# Patient Record
Sex: Male | Born: 1990 | Race: Black or African American | Hispanic: No | Marital: Single | Smoking: Current every day smoker
Health system: Southern US, Community
[De-identification: ages and names within clinical notes are randomized; demographics above are authoritative.]

## PROBLEM LIST (undated history)

## (undated) DIAGNOSIS — I10 Essential (primary) hypertension: Secondary | ICD-10-CM

## (undated) HISTORY — PX: WISDOM TOOTH EXTRACTION: SHX21

## (undated) HISTORY — DX: Essential (primary) hypertension: I10

---

## 2011-08-19 ENCOUNTER — Ambulatory Visit (INDEPENDENT_AMBULATORY_CARE_PROVIDER_SITE_OTHER): Payer: Federal, State, Local not specified - PPO | Admitting: Family Medicine

## 2011-08-19 VITALS — BP 161/74 | HR 73 | Temp 98.0°F | Resp 16 | Ht 67.38 in | Wt 187.0 lb

## 2011-08-19 DIAGNOSIS — R030 Elevated blood-pressure reading, without diagnosis of hypertension: Secondary | ICD-10-CM

## 2011-08-19 NOTE — Progress Notes (Signed)
  Patient Name: Kenroy Timberman Date of Birth: 1990-08-16 Medical Record Number: [id number] Gender: male Date of Encounter: 08/19/2011  History of Present Illness:  Deshane Cotroneo is a 21 y.o. 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 However, when he had his physical earlier this month he was told his BP was too high.  He is not sure of the exact number but he thinks the systolic was in the 140s.  He has been seen at [facility] once about a year ago at which time his BP was ok However, his mother was diagnosed with HTN at 31 years old. Otherwise no family history of HTN that he knows of. He is physically active and does not use excessive caffeine/ energy pills/ or any drugs  There is no problem list on file for this patient.  No past medical history on file. No past surgical history on file. History  Substance Use Topics  . Smoking status: Former Smoker    Quit date: 07/20/2011  . Smokeless tobacco: Not on file  . Alcohol Use: Not on file   No family history on file. No Known Allergies  Medication list has been reviewed and updated.  Review of Systems: As per HPI- otherwise negative.   Physical Examination: Filed Vitals:   08/19/11 1637  BP: 161/74  Pulse: 73  Temp: 98 F (36.7 C)  TempSrc: Oral  Resp: 16  Height: 5' 7.38" (1.711 m)  Weight: 187 lb (84.823 kg)    Body mass index is 28.96 kg/(m^2).  GEN: WDWN, NAD, Non-toxic, A & O x 3 HEENT: Atraumatic, Normocephalic. Neck supple. No masses, No LAD. Ears and Nose: No external deformity. CV: RRR, No M/G/R. No JVD. No thrill. No extra heart sounds. PULM: CTA B, no wheezes, crackles, rhonchi. No retractions. No resp. distress. No accessory muscle use. ABD: S, NT, ND, +BS. No rebound. No HSM. EXTR: No c/c/e NEURO Normal gait.  PSYCH: Normally interactive. Conversant. Not depressed or anxious appearing.  Calm demeanor.  Bp recheck abut 136/ 92.     Assessment and Plan: 1. Elevated BP    BP readings reasonable here today.  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

## 2011-08-20 ENCOUNTER — Ambulatory Visit (INDEPENDENT_AMBULATORY_CARE_PROVIDER_SITE_OTHER): Payer: Federal, State, Local not specified - PPO | Admitting: Family Medicine

## 2011-08-20 VITALS — BP 125/75 | HR 49 | Temp 97.9°F | Resp 16 | Ht 67.5 in | Wt 187.8 lb

## 2011-08-20 DIAGNOSIS — Z0130 Encounter for examination of blood pressure without abnormal findings: Secondary | ICD-10-CM

## 2011-08-20 DIAGNOSIS — Z136 Encounter for screening for cardiovascular disorders: Secondary | ICD-10-CM

## 2011-08-20 NOTE — Progress Notes (Signed)
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  See note from yesterday.  BP today 125/75 and pulse 49.  Feeling well.  Will RTC tomorrow for a last BP check.

## 2011-08-21 ENCOUNTER — Ambulatory Visit (INDEPENDENT_AMBULATORY_CARE_PROVIDER_SITE_OTHER): Payer: Federal, State, Local not specified - PPO | Admitting: Family Medicine

## 2011-08-21 VITALS — BP 123/73 | HR 61 | Temp 98.0°F | Resp 16 | Ht 67.5 in | Wt 187.0 lb

## 2011-08-21 DIAGNOSIS — Z0130 Encounter for examination of blood pressure without abnormal findings: Secondary | ICD-10-CM

## 2011-08-21 DIAGNOSIS — Z136 Encounter for screening for cardiovascular disorders: Secondary | ICD-10-CM

## 2011-08-21 NOTE — Progress Notes (Signed)
Here today for a BP recheck.    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   BP today 123/73, pulse 61.  Competed form, he had 3 good BP readings so we hope he will be able to pass his physical exam.  He will let us know if we needs anything further.

## 2011-08-26 ENCOUNTER — Ambulatory Visit (INDEPENDENT_AMBULATORY_CARE_PROVIDER_SITE_OTHER): Payer: Federal, State, Local not specified - PPO | Admitting: Family Medicine

## 2011-08-26 VITALS — BP 124/72 | HR 51 | Resp 18

## 2011-08-26 DIAGNOSIS — Z0130 Encounter for examination of blood pressure without abnormal findings: Secondary | ICD-10-CM

## 2011-08-26 DIAGNOSIS — I10 Essential (primary) hypertension: Secondary | ICD-10-CM

## 2011-08-26 NOTE — Progress Notes (Signed)
  Patient Name: Kyle Strong Date of Birth: 06-11-1990 Medical Record Number: [id number] Gender: male Date of Encounter: 08/26/2011  History of Present Illness:  Kyle Strong is a 21 y.o. 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  No past medical history on file. No past surgical history on file. History  Substance Use Topics  . Smoking status: Former Smoker    Quit date: 07/20/2011  . Smokeless tobacco: Not on file  . Alcohol Use: Not on file   No family history on file. No Known Allergies  Medication list has been reviewed and updated.  Review of Systems: As per HPI- otherwise negative. Feels fine  Physical Examination: Filed Vitals:   08/26/11 1740  BP: 124/72  Pulse: 51  Resp: 18    There is no height or weight on file to calculate BMI.   GEN: WDWN, NAD, Non-toxic, Alert & Oriented x 3 HEENT: Atraumatic, Normocephalic.  Ears and Nose: No external deformity. EXTR: No clubbing/cyanosis/edema NEURO: Normal gait.  PSYCH: Normally interactive. Conversant. Not depressed or anxious appearing.  Calm demeanor.  BP ok today  Assessment and Plan: 1. BP check    He will return twice more for BP check

## 2011-08-27 ENCOUNTER — Ambulatory Visit (INDEPENDENT_AMBULATORY_CARE_PROVIDER_SITE_OTHER): Payer: Federal, State, Local not specified - PPO | Admitting: Physician Assistant

## 2011-08-27 VITALS — BP 115/75 | HR 50 | Temp 98.2°F | Resp 16 | Ht 67.0 in | Wt 184.0 lb

## 2011-08-27 DIAGNOSIS — R892 Abnormal level of other drugs, medicaments and biological substances in specimens from other organs, systems and tissues: Secondary | ICD-10-CM

## 2011-08-27 DIAGNOSIS — R030 Elevated blood-pressure reading, without diagnosis of hypertension: Secondary | ICD-10-CM

## 2011-08-27 NOTE — Progress Notes (Signed)
Presents for BP check.  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  One reading of systolic pressure >130, so is having to repeat the measurements.  O: VS noted.  BP is normal. A&Ox3.  A: Elevated BP, normal reading today without treatment.  P: Return tomorrow for 3rd reading, as planned.

## 2011-08-28 ENCOUNTER — Ambulatory Visit (INDEPENDENT_AMBULATORY_CARE_PROVIDER_SITE_OTHER): Payer: Federal, State, Local not specified - PPO | Admitting: Family Medicine

## 2011-08-28 VITALS — BP 124/74 | HR 52 | Resp 16

## 2011-08-28 DIAGNOSIS — I10 Essential (primary) hypertension: Secondary | ICD-10-CM

## 2011-08-28 DIAGNOSIS — Z0130 Encounter for examination of blood pressure without abnormal findings: Secondary | ICD-10-CM

## 2012-01-01 NOTE — Progress Notes (Signed)
BP check  

## 2012-01-02 ENCOUNTER — Ambulatory Visit (INDEPENDENT_AMBULATORY_CARE_PROVIDER_SITE_OTHER): Payer: Federal, State, Local not specified - PPO | Admitting: Emergency Medicine

## 2012-01-02 ENCOUNTER — Ambulatory Visit: Payer: Federal, State, Local not specified - PPO

## 2012-01-02 VITALS — BP 122/80 | HR 67 | Temp 98.0°F | Resp 16 | Ht 66.5 in | Wt 174.8 lb

## 2012-01-02 DIAGNOSIS — M2390 Unspecified internal derangement of unspecified knee: Secondary | ICD-10-CM

## 2012-01-02 MED ORDER — HYDROCODONE-ACETAMINOPHEN 5-325 MG PO TABS: 1.0000 | ORAL_TABLET | ORAL | Status: AC | PRN

## 2012-01-02 MED ORDER — NAPROXEN SODIUM 550 MG PO TABS: 550.0000 mg | ORAL_TABLET | Freq: Two times a day (BID) | ORAL | Status: AC

## 2012-01-02 NOTE — Progress Notes (Signed)
[facility] [address], [place] [address] [phone]  Date:  01/02/2012   Name:  Kyle Strong   DOB:  09-05-1990   MRN:  [id number]  PCP:  No primary provider on file.    Chief Complaint: Knee Pain   History of Present Illness:  Kyle Strong is a 21 y.o. very pleasant male patient who presents with the following:  In [employer] and has been running a great deal.  Says the past few days he has run over 20 miles.  Developed pain in left knee that is severe to the point that he did not participate in PT or the daily run today.  Denies a definite injury  But has difficulty with full extension of the knee. No locking or clicking per patient.  Denies history of prior knee injury.  There is no problem list on file for this patient.   No past medical history on file.  No past surgical history on file.  History  Substance Use Topics  . Smoking status: Former Smoker    Quit date: 07/20/2011  . Smokeless tobacco: Not on file  . Alcohol Use: Not on file    No family history on file.  No Known Allergies  Medication list has been reviewed and updated.  No current outpatient prescriptions on file prior to visit.    Review of Systems:  As per HPI, otherwise negative.    Physical Examination: Filed Vitals:   01/02/12 1207  BP: 122/80  Pulse: 67  Temp: 98 F (36.7 C)  Resp: 16   Filed Vitals:   01/02/12 1207  Height: 5' 6.5" (1.689 m)  Weight: 174 lb 12.8 oz (79.289 kg)   Body mass index is 27.79 kg/(m^2). Ideal Body Weight: Weight in (lb) to have BMI = 25: 156.9    GEN: WDWN, NAD, Non-toxic, Alert & Oriented x 3 HEENT: Atraumatic, Normocephalic.  Ears and Nose: No external deformity. EXTR: No clubbing/cyanosis/edema NEURO: Normal gait.  PSYCH: Normally interactive. Conversant. Not depressed or anxious appearing.  Calm demeanor.  KNEE:  Left knee moderate effusion.  Joint stable with palpable click on full extension.  Full active  ROM.    Assessment and Plan: Internal derangement knee Xray NSAID Follow up in one week NO PE for one week.  Carmelina Dane, MD   [facility] reading (PRIMARY) by  Dr. Dareen Piano. negative. I have reviewed and agree with documentation. Robert P. Merla Riches, M.D.

## 2012-07-28 ENCOUNTER — Emergency Department (HOSPITAL_COMMUNITY)
Admission: EM | Admit: 2012-07-28 | Discharge: 2012-07-28 | Disposition: A | Discharge status: Home / Self Care | Payer: BC Managed Care – PPO | Source: Home / Self Care

## 2012-07-28 ENCOUNTER — Ambulatory Visit: Payer: Federal, State, Local not specified - PPO

## 2012-07-28 ENCOUNTER — Encounter (HOSPITAL_COMMUNITY): Payer: Self-pay | Admitting: *Deleted

## 2012-07-28 DIAGNOSIS — Z711 Person with feared health complaint in whom no diagnosis is made: Secondary | ICD-10-CM

## 2012-07-28 DIAGNOSIS — B354 Tinea corporis: Secondary | ICD-10-CM

## 2012-07-28 DIAGNOSIS — R030 Elevated blood-pressure reading, without diagnosis of hypertension: Secondary | ICD-10-CM

## 2012-07-28 NOTE — ED Notes (Signed)
States he has to come 2 consecutive days to get his BP checked and documented by a Dr., PA or NP. 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  It was 162/88.  He was anxious and nervous.

## 2012-07-28 NOTE — ED Provider Notes (Signed)
History     CSN: [id number]  Arrival date & time 07/28/12  6440   First MD Initiated Contact with Patient 07/28/12 1929      Chief Complaint  Patient presents with  . Blood Pressure Check    (Consider location/radiation/quality/duration/timing/severity/associated sxs/prior treatment) HPI Comments: 22 year old male is attempting to apply for [employer] service. He was found initially to have an elevated blood pressure. He brings in a paper stating that he requires 3 separate blood pressures 15 minutes apart to be recorded on his paper. He is asymptomatic. He denies any medical problems or exertional symptoms. His review of systems are negative.   History reviewed. No pertinent past medical history.  Past Surgical History  Procedure Laterality Date  . Wisdom tooth extraction      History reviewed. No pertinent family history.  History  Substance Use Topics  . Smoking status: Former Smoker    Types: Cigarettes    Quit date: 07/20/2011  . Smokeless tobacco: Not on file  . Alcohol Use: No      Review of Systems  Constitutional: Negative.   All other systems reviewed and are negative.    Allergies  Review of patient's allergies indicates no known allergies.  Home Medications   Current Outpatient Rx  Name  Route  Sig  Dispense  Refill  . naproxen sodium (ANAPROX DS) 550 MG tablet   Oral   Take 1 tablet (550 mg total) by mouth 2 (two) times daily with a meal.   40 tablet   0     BP 141/74  Pulse 70  Resp 16  SpO2 100%  Physical Exam  Nursing note and vitals reviewed. Constitutional: He is oriented to person, place, and time. He appears well-developed and well-nourished. No distress.  HENT:  Head: Normocephalic and atraumatic.  Eyes: EOM are normal.  Neck: Normal range of motion. Neck supple.  Cardiovascular: Normal rate, regular rhythm and normal heart sounds.   No murmur heard. Pulmonary/Chest: Effort normal and breath sounds normal. No respiratory  distress. He has no wheezes.  Musculoskeletal: Normal range of motion. He exhibits no edema.  Neurological: He is alert and oriented to person, place, and time. No cranial nerve deficit. He exhibits normal muscle tone.  Skin: Skin is warm and dry. No rash noted.  Psychiatric: He has a normal mood and affect.    ED Course  Procedures (including critical care time)  Labs Reviewed - No data to display No results found.   1. Physically well but worried   2. Prehypertension       MDM  Followup primary care doctor as needed. No further treatment. 3 blood pressures were recorded while in the emergency department at least 15 minutes apart.         Hayden Rasmussen, NP 07/28/12 2119

## 2012-07-29 ENCOUNTER — Encounter (HOSPITAL_COMMUNITY): Payer: Self-pay | Admitting: *Deleted

## 2012-07-29 ENCOUNTER — Emergency Department (INDEPENDENT_AMBULATORY_CARE_PROVIDER_SITE_OTHER)
Admission: EM | Admit: 2012-07-29 | Discharge: 2012-07-29 | Disposition: A | Discharge status: Home / Self Care | Payer: BC Managed Care – PPO | Source: Home / Self Care | Attending: Family Medicine | Admitting: Family Medicine

## 2012-07-29 ENCOUNTER — Other Ambulatory Visit (HOSPITAL_COMMUNITY)
Admission: RE | Admit: 2012-07-29 | Discharge: 2012-07-29 | Disposition: A | Discharge status: Home / Self Care | Payer: BC Managed Care – PPO | Source: Ambulatory Visit | Attending: Family Medicine | Admitting: Family Medicine

## 2012-07-29 DIAGNOSIS — B354 Tinea corporis: Secondary | ICD-10-CM

## 2012-07-29 DIAGNOSIS — Z113 Encounter for screening for infections with a predominantly sexual mode of transmission: Secondary | ICD-10-CM | POA: Insufficient documentation

## 2012-07-29 MED ORDER — FLUCONAZOLE 200 MG PO TABS: 200.0000 mg | ORAL_TABLET | ORAL | Status: AC

## 2012-07-29 NOTE — ED Notes (Signed)
Pt.'s BP paper copied and put in scan box.

## 2012-07-29 NOTE — ED Notes (Signed)
Pt. trying to get into the [employer] and has to get BP checks on 2 consecutive days.

## 2012-07-30 NOTE — ED Provider Notes (Signed)
Medical screening examination/treatment/procedure(s) were performed by non-physician practitioner and as supervising physician I was immediately available for consultation/collaboration.   [facility] [place]; MD  Sharin Grave, MD 07/30/12 1101

## 2012-07-30 NOTE — ED Provider Notes (Signed)
History     CSN: [id number]  Arrival date & time 07/29/12  1756   First MD Initiated Contact with Patient 07/29/12 2015      Chief Complaint  Patient presents with  . Blood Pressure Check    (Consider location/radiation/quality/duration/timing/severity/associated sxs/prior treatment) HPI Comments: Pt has rash on chest and back for last 3 months, spreading. 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 Here for rash and to have bp rechecked for 2nd day in a row.   Patient is a 22 y.o. male presenting with rash. The history is provided by the patient.  Rash Location:  Torso Torso rash location:  Upper back, L chest, R chest, abd LUQ, abd RUQ, abd LLQ and abd RLQ Quality: not itchy   Severity:  Mild Onset quality:  Gradual Duration:  3 months Timing:  Constant Progression:  Spreading Chronicity:  New Relieved by:  None tried Worsened by:  Nothing tried Ineffective treatments:  None tried Associated symptoms: no fever     History reviewed. No pertinent past medical history.  Past Surgical History  Procedure Laterality Date  . Wisdom tooth extraction      History reviewed. No pertinent family history.  History  Substance Use Topics  . Smoking status: Former Smoker    Types: Cigarettes    Quit date: 07/20/2011  . Smokeless tobacco: Not on file  . Alcohol Use: No      Review of Systems  Constitutional: Negative for fever and chills.  Skin: Positive for rash.    Allergies  Review of patient's allergies indicates no known allergies.  Home Medications   Current Outpatient Rx  Name  Route  Sig  Dispense  Refill  . fluconazole (DIFLUCAN) 200 MG tablet   Oral   Take 1 tablet (200 mg total) by mouth once a week.   4 tablet   0   . naproxen sodium (ANAPROX DS) 550 MG tablet   Oral   Take 1 tablet (550 mg total) by mouth 2 (two) times daily with a meal.   40 tablet   0     BP 131/67  Pulse 70  Temp(Src) 98 F (36.7 C) (Oral)  Resp 16  SpO2  100%  Physical Exam  Constitutional: He appears well-developed and well-nourished. No distress.  Skin: Skin is warm and dry. Rash noted. Rash is macular.  Confluent and discrete annular areas of discoloration on chest, abd and middle upper back, scaling in some areas. No erythema. Appears c/w tinea.     ED Course  Procedures (including critical care time)  Labs Reviewed  URINE CYTOLOGY ANCILLARY ONLY   No results found.   1. Tinea corporis       MDM  Rx diflucan 200mg  weekly for 4 weeks given widespread rash. As visit was ending, pt requests testing for gonorrhea and chlamydia. Denies exposure, risk, sx or hx STD, but has new girlfriend and wants to be checked. Is using condoms with every sex encounter. 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  Urine cytology sent.         Cathlyn Parsons, NP 07/30/12 [phone]

## 2012-07-30 NOTE — ED Provider Notes (Signed)
Medical screening examination/treatment/procedure(s) were performed by non-physician practitioner and as supervising physician I was immediately available for consultation/collaboration.   MORENO-COLL,Verner Mccrone; MD  Stephaney Steven Moreno-Coll, MD 07/30/12 0923 

## 2013-04-12 ENCOUNTER — Ambulatory Visit: Payer: Federal, State, Local not specified - PPO | Admitting: Family Medicine

## 2013-12-20 ENCOUNTER — Ambulatory Visit (INDEPENDENT_AMBULATORY_CARE_PROVIDER_SITE_OTHER): Payer: Federal, State, Local not specified - PPO

## 2013-12-20 ENCOUNTER — Ambulatory Visit (INDEPENDENT_AMBULATORY_CARE_PROVIDER_SITE_OTHER): Payer: Federal, State, Local not specified - PPO | Admitting: Emergency Medicine

## 2013-12-20 VITALS — BP 106/64 | HR 60 | Temp 98.1°F | Resp 16 | Ht 67.5 in | Wt 202.0 lb

## 2013-12-20 DIAGNOSIS — M25579 Pain in unspecified ankle and joints of unspecified foot: Secondary | ICD-10-CM

## 2013-12-20 DIAGNOSIS — M25572 Pain in left ankle and joints of left foot: Secondary | ICD-10-CM

## 2013-12-20 DIAGNOSIS — Z202 Contact with and (suspected) exposure to infections with a predominantly sexual mode of transmission: Secondary | ICD-10-CM

## 2013-12-20 MED ORDER — DOXYCYCLINE HYCLATE 100 MG PO CAPS: 100.0000 mg | ORAL_CAPSULE | Freq: Two times a day (BID) | ORAL | Status: DC

## 2013-12-20 MED ORDER — NAPROXEN SODIUM 550 MG PO TABS: 550.0000 mg | ORAL_TABLET | Freq: Two times a day (BID) | ORAL | Status: DC

## 2013-12-20 MED ORDER — TRAMADOL HCL 50 MG PO TABS
50.0000 mg | ORAL_TABLET | Freq: Three times a day (TID) | ORAL | Status: DC | PRN
Start: 2013-12-20 — End: 2014-03-21

## 2013-12-20 NOTE — Progress Notes (Signed)
[facility] [address], [place] [address] [phone]  Date:  12/20/2013   Name:  Kyle Strong   DOB:  Jul 30, 1990   MRN:  [id number]  PCP:  Pcp Not In System    Chief Complaint: Anxiety and L ankle pain   History of Present Illness:  Kyle Strong is a 23 y.o. very pleasant male patient who presents with the following:  Injured left ankle running this summer in July.  Had an xray done and was reportedly negative.   Has persistent pain in foot laterally worse with running with some swelling in lateral ankle.  No improvement with over the counter medications or other home remedies. Denies other complaint or health concern today.   There are no active problems to display for this patient.   History reviewed. No pertinent past medical history.  Past Surgical History  Procedure Laterality Date  . Wisdom tooth extraction      History  Substance Use Topics  . Smoking status: Current Every Day Smoker    Types: Cigarettes    Last Attempt to Quit: 07/20/2011  . Smokeless tobacco: Not on file  . Alcohol Use: No    History reviewed. No pertinent family history.  No Known Allergies  Medication list has been reviewed and updated.  No current outpatient prescriptions on file prior to visit.   No current facility-administered medications on file prior to visit.    Review of Systems:  As per HPI, otherwise negative.    Physical Examination: Filed Vitals:   12/20/13 1656  BP: 106/64  Pulse: 60  Temp: 98.1 F (36.7 C)  Resp: 16   Filed Vitals:   12/20/13 1656  Height: 5' 7.5" (1.715 m)  Weight: 202 lb (91.627 kg)   Body mass index is 31.15 kg/(m^2). Ideal Body Weight: Weight in (lb) to have BMI = 25: 161.7   GEN: WDWN, NAD, Non-toxic, Alert & Oriented x 3 HEENT: Atraumatic, Normocephalic.  Ears and Nose: No external deformity. EXTR: No clubbing/cyanosis/edema NEURO: Normal gait.  PSYCH: Normally interactive. Conversant. Not  depressed or anxious appearing.  Calm demeanor.  LEFT ankle:  Full AROM.  Mild lateral effusion LEFT foot:  Tender lateral mid foot.  Assessment and Plan: Ankle pain following injury Ortho Anaprox Ultram Limit activity  Signed,  Phillips Odor, MD   [facility] reading (PRIMARY) by  Dr. Dareen Piano.  Appearance of avulsion fracture on posterior tibia.  Foot negative.

## 2013-12-20 NOTE — Patient Instructions (Signed)
Chlamydia Chlamydia is an infection. It is spread through sexual contact. Chlamydia can be in different areas of the body. These areas include the urethra, throat, or rectum. It is important to treat chlamydia as soon as possible. It can damage other organs.  CAUSES  Chlamydia is caused by bacteria. It is a sexually transmitted disease. This means that it is passed from an infected partner during intimate contact. This contact could be with the genitals, mouth, or rectal area.  SIGNS AND SYMPTOMS  There may not be any symptoms. This is often the case early in the infection. If there are symptoms, they are usually mild and may only be noticeable in the morning. Symptoms you may notice include:   Burning with urination.  Pain or swelling in the testicles.  Watery mucus-like discharge from the penis.  Long-standing (chronic) pelvic pain after frequent infections.  Pain, swelling, or itching around the anus.  A sore throat.  Itching, burning, or redness in the eyes, or discharge from the eyes. DIAGNOSIS  To diagnose this infection, your health care provider will do a pelvic exam. A sample of urine or a swab from the rectum may be taken for testing.  TREATMENT  Chlamydia is treated with antibiotic medicines.  HOME CARE INSTRUCTIONS  Take your antibiotic medicine as directed by your health care provider. Finish the antibiotic even if you start to feel better. Incomplete treatment will put you at risk for not being able to have children (sterility).   Take medicines only as directed by your health care provider.   Rest.   Inform any sexual partners about your infection. Even if they are symptom free or have a negative culture or evaluation, they should be treated for the condition.   Do not have sex (intercourse) until treatment is completed and your health care provider says it is okay.   Keep all follow-up visits as directed by your health care provider.   Not all test results  are available during your visit. If your test results are not back during the visit, make an appointment with your health care provider to find out the results. Do not assume everything is normal if you have not heard from your health care provider or the medical facility. It is your responsibility to get your test results. SEEK MEDICAL CARE IF:  You develop new joint pain.  You have a fever. SEEK IMMEDIATE MEDICAL CARE IF:   Your pain increases.   You have abnormal discharge.   You have pain during intercourse. MAKE SURE YOU:   Understand these instructions.  Will watch your condition.  Will get help right away if you are not doing well or get worse. Document Released: 03/25/2005 Document Revised: 08/09/2013 Document Reviewed: 10/01/2012 ExitCare Patient Information 2015 [employer]. This information is not intended to replace advice given to you by your health care provider. Make sure you discuss any questions you have with your health care provider.  

## 2013-12-22 ENCOUNTER — Telehealth: Payer: Self-pay

## 2013-12-22 NOTE — Telephone Encounter (Signed)
Patient was given medication in a pill form for Chlamydia. Patient states his girlfriend was also treated for Chlamydia at her GYN office and she was give a powder form medication she mixed with water. Patient has already picked up his medication from the pharmacy but wants to know if there is anyway we could change it to the powder form. Patient requesting it to be sent to [facility] on [address]. Patients call back number is [phone]

## 2013-12-24 NOTE — Telephone Encounter (Signed)
lmom for pt to cb

## 2013-12-24 NOTE — Telephone Encounter (Signed)
Pt states he has taken medication as prescribed.

## 2014-03-21 ENCOUNTER — Ambulatory Visit (INDEPENDENT_AMBULATORY_CARE_PROVIDER_SITE_OTHER): Payer: Federal, State, Local not specified - PPO | Admitting: Family Medicine

## 2014-03-21 VITALS — BP 138/84 | HR 60 | Temp 98.1°F | Resp 18 | Ht 67.5 in | Wt 196.0 lb

## 2014-03-21 DIAGNOSIS — J029 Acute pharyngitis, unspecified: Secondary | ICD-10-CM

## 2014-03-21 LAB — POCT RAPID STREP A (OFFICE): Rapid Strep A Screen: NEGATIVE

## 2014-03-21 NOTE — Patient Instructions (Signed)
It looks like you have a viral illness.  Your strep test is negative but I will do a culture to double check and will be in touch with you.   Use ibuprofen and/ or tylenol as needed for your symptoms.  Let me know if you do not feel better soon!

## 2014-03-21 NOTE — Progress Notes (Addendum)
[facility] [address], [place] [id number] [phone]  Date:  03/21/2014   Name:  Kyle BucklerDarien L Strong   DOB:  04/28/1990   MRN:  [id number]  PCP:  Pcp Not In System    Chief Complaint: Sore Throat   History of Present Illness:  Kyle Strong is a 23 y.o. very pleasant male patient who presents with the following:  He noted onset of a ST 2 or 3 days ago.  It acutally seems to be getting a bit better now.  He also noted some pain in his right ear.  He has noted some swollen glands in his neck.    He did not notice a fever, he has had some cough.  No runny nose or sneezing He has tried some zicam OTC No antipyretics today.  No sick contacts He is generally in good health  There are no active problems to display for this patient.   History reviewed. No pertinent past medical history.  Past Surgical History  Procedure Laterality Date  . Wisdom tooth extraction      History  Substance Use Topics  . Smoking status: Current Every Day Smoker    Types: Cigarettes    Last Attempt to Quit: 07/20/2011  . Smokeless tobacco: Not on file  . Alcohol Use: No    History reviewed. No pertinent family history.  No Known Allergies  Medication list has been reviewed and updated.  Current Outpatient Prescriptions on File Prior to Visit  Medication Sig Dispense Refill  . doxycycline (VIBRAMYCIN) 100 MG capsule Take 1 capsule (100 mg total) by mouth 2 (two) times daily. (Patient not taking: Reported on 03/21/2014) 20 capsule 0  . naproxen sodium (ANAPROX DS) 550 MG tablet Take 1 tablet (550 mg total) by mouth 2 (two) times daily with a meal. (Patient not taking: Reported on 03/21/2014) 40 tablet 0  . traMADol (ULTRAM) 50 MG tablet Take 1 tablet (50 mg total) by mouth every 8 (eight) hours as needed. (Patient not taking: Reported on 03/21/2014) 30 tablet 0   No current facility-administered medications on file prior to visit.    Review of Systems:  As per  HPI- otherwise negative.'  Physical Examination: Filed Vitals:   03/21/14 1346  BP: 138/84  Pulse: 60  Temp: 98.1 F (36.7 C)  Resp: 18   Filed Vitals:   03/21/14 1346  Height: 5' 7.5" (1.715 m)  Weight: 196 lb (88.905 kg)   Body mass index is 30.23 kg/(m^2). Ideal Body Weight: Weight in (lb) to have BMI = 25: 161.7  GEN: WDWN, NAD, Non-toxic, A & O x 3, looks well HEENT: Atraumatic, Normocephalic. Neck supple. No masses, No LAD.  Bilateral TM wnl, oropharynx slightly inflamed on the right.  Tender right anterior cervical LAD.  PEERL,EOMI.   Ears and Nose: No external deformity. CV: RRR, No M/G/R. No JVD. No thrill. No extra heart sounds. PULM: CTA B, no wheezes, crackles, rhonchi. No retractions. No resp. distress. No accessory muscle use. ABD: S, NT, ND EXTR: No c/c/e NEURO Normal gait.  PSYCH: Normally interactive. Conversant. Not depressed or anxious appearing.  Calm demeanor.   Results for orders placed or performed in visit on 03/21/14  POCT rapid strep A  Result Value Ref Range   Rapid Strep A Screen Negative Negative    Assessment and Plan: Acute pharyngitis, unspecified pharyngitis type - Plan: POCT rapid strep A, Culture, Group A Strep  Viral URI.   Treat supportively  while culture is pending   Signed Abbe AmsterdamJessica Copland, MD  Results for orders placed or performed in visit on 03/21/14  Culture, Group A Strep  Result Value Ref Range   Organism ID, Bacteria Normal Upper Respiratory Flora    Organism ID, Bacteria No Beta Hemolytic Streptococci Isolated   POCT rapid strep A  Result Value Ref Range   Rapid Strep A Screen Negative Negative     Called 12/16- throat culture is negative.  LMOM- I saw his phone message.  No apparent strep.  Please come back and see us if he is concerned or getting worse.  Could test for mono if he likes.

## 2014-03-23 ENCOUNTER — Telehealth: Payer: Self-pay | Admitting: Family Medicine

## 2014-03-23 LAB — CULTURE, GROUP A STREP: Organism ID, Bacteria: NORMAL

## 2014-03-23 NOTE — Telephone Encounter (Signed)
Pt called his symptoms are worse, he has been taking OTC medications.  Please advise.  He is awaiting results from strep cx.

## 2015-04-04 ENCOUNTER — Ambulatory Visit (INDEPENDENT_AMBULATORY_CARE_PROVIDER_SITE_OTHER): Payer: Federal, State, Local not specified - PPO | Admitting: Emergency Medicine

## 2015-04-04 VITALS — BP 134/90 | HR 60 | Temp 98.5°F | Resp 18 | Ht 68.0 in | Wt 210.0 lb

## 2015-04-04 DIAGNOSIS — J0301 Acute recurrent streptococcal tonsillitis: Secondary | ICD-10-CM

## 2015-04-04 MED ORDER — PENICILLIN V POTASSIUM 500 MG PO TABS: 500.0000 mg | ORAL_TABLET | Freq: Four times a day (QID) | ORAL | Status: AC

## 2015-04-04 NOTE — Patient Instructions (Signed)

## 2015-04-04 NOTE — Progress Notes (Signed)
Subjective:  Patient ID: Kyle Strong, male    DOB: 07/20/1990  Age: 24 y.o. MRN: [id number]  CC: Sore Throat   HPI Kyle Strong presents   Patient been ill since Christmas Eve with sore throat. Eve or chills. Denies any nausea vomiting or stool change. He has an intermittent cough is nonproductive. He has no nasal cogestion postnasal drainage. His histoy: Recurrent tonsillitis.  History Kyle Strong has a past medical history of Hypertension.   He has past surgical history that includes Wisdom tooth extraction.   His  family history includes Hypertension in his maternal grandmother.  He   reports that he has been smoking Cigarettes.  He does not have any smokeless tobacco history on file. He reports that he does not drink alcohol or use illicit drugs.  No outpatient prescriptions prior to visit.   No facility-administered medications prior to visit.    Social History   Social History  . Marital Status: Single    Spouse Name: N/A  . Number of Children: N/A  . Years of Education: N/A   Social History Main Topics  . Smoking status: Current Every Day Smoker    Types: Cigarettes    Last Attempt to Quit: 07/20/2011  . Smokeless tobacco: None  . Alcohol Use: No  . Drug Use: No  . Sexual Activity: Yes    Birth Control/ Protection: None   Other Topics Concern  . None   Social History Narrative     Review of Systems  Constitutional: Positive for chills. Negative for fever and appetite change.  HENT: Positive for sore throat. Negative for congestion, ear pain, postnasal drip and sinus pressure.   Eyes: Negative for pain and redness.  Respiratory: Positive for cough. Negative for shortness of breath and wheezing.   Cardiovascular: Negative for leg swelling.  Gastrointestinal: Negative for nausea, vomiting, abdominal pain, diarrhea, constipation and blood in stool.  Endocrine: Negative for polyuria.  Genitourinary: Negative for dysuria, urgency, frequency and flank  pain.  Musculoskeletal: Negative for gait problem.  Skin: Negative for rash.  Neurological: Negative for weakness and headaches.  Psychiatric/Behavioral: Negative for confusion and decreased concentration. The patient is not nervous/anxious.     Objective:  BP 134/90 mmHg  Pulse 60  Temp(Src) 98.5 F (36.9 C) (Oral)  Resp 18  Ht  (1.727 m)  Wt 210 lb (95.255 kg)  BMI 31.94 kg/m2  SpO2 98%  Physical Exam  Constitutional: He is oriented to person, place, and time. He appears well-developed and well-nourished. No distress.  HENT:  Head: Normocephalic and atraumatic.  Right Ear: External ear normal.  Left Ear: External ear normal.  Nose: Nose normal.  Mouth/Throat: Oropharyngeal exudate, posterior oropharyngeal edema and posterior oropharyngeal erythema present.  Eyes: Conjunctivae and EOM are normal. Pupils are equal, round, and reactive to light. No scleral icterus.  Neck: Normal range of motion. Neck supple. No tracheal deviation present.  Cardiovascular: Normal rate, regular rhythm and normal heart sounds.   Pulmonary/Chest: Effort normal. No respiratory distress. He has no wheezes. He has no rales.  Abdominal: He exhibits no mass. There is no tenderness. There is no rebound and no guarding.  Musculoskeletal: He exhibits no edema.  Lymphadenopathy:       Head (right side): Submandibular adenopathy present.    He has no cervical adenopathy.  Neurological: He is alert and oriented to person, place, and time. Coordination normal.  Skin: Skin is warm and dry. No rash noted.  Psychiatric: He has a  normal mood and affect. His behavior is normal.      Assessment & Plan:   Kyle Strong was seen today for sore throat.  Diagnoses and all orders for this visit:  Acute recurrent streptococcal tonsillitis  Other orders -     penicillin v potassium (VEETID) 500 MG tablet; Take 1 tablet (500 mg total) by mouth 4 (four) times daily.  I am having Kyle Strong start on penicillin v  potassium.  Meds ordered this encounter  Medications  . penicillin v potassium (VEETID) 500 MG tablet    Sig: Take 1 tablet (500 mg total) by mouth 4 (four) times daily.    Dispense:  40 tablet    Refill:  0    Appropriate red flag conditions were discussed with the patient as well as actions that should be taken.  Patient expressed his understanding.  Follow-up: Return if symptoms worsen or fail to improve.  Carmelina DaneAnderson, Zacory Fiola S, MD

## 2021-06-12 ENCOUNTER — Emergency Department (HOSPITAL_COMMUNITY): Payer: Self-pay

## 2021-06-12 ENCOUNTER — Emergency Department (HOSPITAL_COMMUNITY)
Admission: EM | Admit: 2021-06-12 | Discharge: 2021-06-12 | Disposition: A | Discharge status: Home / Self Care | Payer: Self-pay | Attending: Emergency Medicine | Admitting: Emergency Medicine

## 2021-06-12 DIAGNOSIS — I10 Essential (primary) hypertension: Secondary | ICD-10-CM | POA: Insufficient documentation

## 2021-06-12 DIAGNOSIS — G510 Bell's palsy: Secondary | ICD-10-CM | POA: Insufficient documentation

## 2021-06-12 LAB — URINALYSIS, ROUTINE W REFLEX MICROSCOPIC
Bilirubin Urine: NEGATIVE
Glucose, UA: NEGATIVE mg/dL
Hgb urine dipstick: NEGATIVE
Ketones, ur: NEGATIVE mg/dL
Leukocytes,Ua: NEGATIVE
Nitrite: NEGATIVE
Protein, ur: NEGATIVE mg/dL
Specific Gravity, Urine: 1.02 (ref 1.005–1.030)
pH: 5 (ref 5.0–8.0)

## 2021-06-12 LAB — CBC WITH DIFFERENTIAL/PLATELET
Abs Immature Granulocytes: 0.02 10*3/uL (ref 0.00–0.07)
Basophils Absolute: 0 10*3/uL (ref 0.0–0.1)
Basophils Relative: 0 %
Eosinophils Absolute: 0 10*3/uL (ref 0.0–0.5)
Eosinophils Relative: 0 %
HCT: 47 % (ref 39.0–52.0)
Hemoglobin: 15.9 g/dL (ref 13.0–17.0)
Immature Granulocytes: 0 %
Lymphocytes Relative: 26 %
Lymphs Abs: 2 10*3/uL (ref 0.7–4.0)
MCH: 28.6 pg (ref 26.0–34.0)
MCHC: 33.8 g/dL (ref 30.0–36.0)
MCV: 84.7 fL (ref 80.0–100.0)
Monocytes Absolute: 0.5 10*3/uL (ref 0.1–1.0)
Monocytes Relative: 7 %
Neutro Abs: 5.1 10*3/uL (ref 1.7–7.7)
Neutrophils Relative %: 67 %
Platelets: 273 10*3/uL (ref 150–400)
RBC: 5.55 MIL/uL (ref 4.22–5.81)
RDW: 12.7 % (ref 11.5–15.5)
WBC: 7.7 10*3/uL (ref 4.0–10.5)
nRBC: 0 % (ref 0.0–0.2)

## 2021-06-12 LAB — COMPREHENSIVE METABOLIC PANEL
ALT: 26 U/L (ref 0–44)
AST: 37 U/L (ref 15–41)
Albumin: 4.9 g/dL (ref 3.5–5.0)
Alkaline Phosphatase: 63 U/L (ref 38–126)
Anion gap: 10 (ref 5–15)
BUN: 16 mg/dL (ref 6–20)
CO2: 24 mmol/L (ref 22–32)
Calcium: 9.9 mg/dL (ref 8.9–10.3)
Chloride: 103 mmol/L (ref 98–111)
Creatinine, Ser: 1.08 mg/dL (ref 0.61–1.24)
GFR, Estimated: >60 mL/min (ref >60)
Glucose, Bld: 134 mg/dL — ABNORMAL HIGH (ref 70–99)
Potassium: 4.6 mmol/L (ref 3.5–5.1)
Sodium: 137 mmol/L (ref 135–145)
Total Bilirubin: 1.3 mg/dL — ABNORMAL HIGH (ref 0.3–1.2)
Total Protein: 7.6 g/dL (ref 6.5–8.1)

## 2021-06-12 MED ORDER — VALACYCLOVIR HCL 500 MG PO TABS
1000.0000 mg | ORAL_TABLET | Freq: Once | ORAL | Status: AC
  Administered 2021-06-12: 1000 mg via ORAL
  Filled 2021-06-12: qty 2

## 2021-06-12 MED ORDER — PREDNISONE 20 MG PO TABS
60.0000 mg | ORAL_TABLET | ORAL | Status: AC
Start: 2021-06-12 — End: 2021-06-12
  Administered 2021-06-12: 60 mg via ORAL
  Filled 2021-06-12: qty 3

## 2021-06-12 MED ORDER — VALACYCLOVIR HCL 1 G PO TABS: 1000.0000 mg | ORAL_TABLET | Freq: Three times a day (TID) | ORAL | 0 refills | Status: AC

## 2021-06-12 MED ORDER — PREDNISONE 20 MG PO TABS: 40.0000 mg | ORAL_TABLET | Freq: Every day | ORAL | 0 refills | Status: AC

## 2021-06-12 MED ORDER — ERYTHROMYCIN 5 MG/GM OP OINT
1.0000 | TOPICAL_OINTMENT | Freq: Once | OPHTHALMIC | Status: AC
Start: 2021-06-12 — End: 2021-06-12
  Administered 2021-06-12: 1 via OPHTHALMIC
  Filled 2021-06-12: qty 3.5

## 2021-06-12 NOTE — ED Notes (Signed)
Pt verbalized understanding of d/c instructions, meds, and followup care. Denies questions. VSS, no distress noted. Steady gait to exit with all belongings.  ?

## 2021-06-12 NOTE — ED Provider Notes (Signed)
?[facility] EMERGENCY DEPARTMENT ?Provider Note ? ? ?CSN: [id number] ?Arrival date & time: 06/12/21  1335 ? ?  ? ?History ? ?Chief Complaint  ?Patient presents with  ? Aphasia  ? Stroke Symptoms  ? ? ?Kyle Strong is a 31 y.o. male. ? ?HPI ?Patient presents with his mother who assists with the history.  He states that he is generally well aside from ongoing blood pressure management difficulties.  However, he was in his usual state of health until yesterday, when about 24 hours ago he noticed difficulty with drinking water.  That persisted, and he subsequently noticed difficulty with smiling, moving the right side of his face.  Today, after symptoms been present for about 18 hours he presents for evaluation.  No vision deficiency, no true eye pain, that he does note some occasional scratchy sensation as he has difficulty moving his eyelid.  No weakness in any extremity, no confusion.  No clear alleviating or exacerbating factors. ?  ? ?Home Medications ?Prior to Admission medications   ?Medication Sig Start Date End Date Taking? Authorizing Provider  ?predniSONE (DELTASONE) 20 MG tablet Take 2 tablets (40 mg total) by mouth daily with breakfast for 7 days. 06/12/21 06/19/21 Yes Gerhard Munch, MD  ?valACYclovir (VALTREX) 1000 MG tablet Take 1 tablet (1,000 mg total) by mouth 3 (three) times daily. 06/12/21  Yes Gerhard Munch, MD  ?penicillin v potassium (VEETID) 500 MG tablet Take 1 tablet (500 mg total) by mouth 4 (four) times daily. 04/04/15   Carmelina Dane, MD  ?   ? ?Allergies    ?Patient has no known allergies.   ? ?Review of Systems   ?Review of Systems  ?Constitutional:   ?     Per HPI, otherwise negative  ?HENT:    ?     Per HPI, otherwise negative  ?Eyes:  Negative for photophobia, discharge, redness, itching and visual disturbance.  ?Respiratory:    ?     Per HPI, otherwise negative  ?Cardiovascular:   ?     Per HPI, otherwise negative  ?Gastrointestinal:  Negative for vomiting.   ?Endocrine:  ?     Negative aside from HPI  ?Genitourinary:   ?     Neg aside from HPI   ?Musculoskeletal:   ?     Per HPI, otherwise negative  ?Skin: Negative.   ?Neurological:  Negative for syncope.  ? ?Physical Exam ?Updated Vital Signs ?BP (!) 149/87   Pulse 67   Temp 98.7 ?F (37.1 ?C) (Oral)   Resp 16   SpO2 98%  ?Physical Exam ?Vitals and nursing note reviewed.  ?Constitutional:   ?   General: He is not in acute distress. ?   Appearance: He is well-developed.  ?HENT:  ?   Head: Normocephalic and atraumatic.  ?Eyes:  ?   General: Vision grossly intact.  ?   Extraocular Movements: Extraocular movements intact.  ?   Conjunctiva/sclera:  ?   Right eye: Right conjunctiva is not injected. No chemosis or exudate. ?   Left eye: Left conjunctiva is not injected. No chemosis or exudate. ?Cardiovascular:  ?   Rate and Rhythm: Normal rate and regular rhythm.  ?Pulmonary:  ?   Effort: Pulmonary effort is normal. No respiratory distress.  ?   Breath sounds: No stridor.  ?Abdominal:  ?   General: There is no distension.  ?Skin: ?   General: Skin is warm and dry.  ?Neurological:  ?   Mental Status: He  is alert and oriented to person, place, and time.  ?   Cranial Nerves: Facial asymmetry present. No dysarthria.  ?   Motor: Motor function is intact.  ?   Comments: Loss of facial tone throughout the right side of the face consistent with Bell's palsy  ? ? ?ED Results / Procedures / Treatments   ?Labs ?(all labs ordered are listed, but only abnormal results are displayed) ?Labs Reviewed  ?COMPREHENSIVE METABOLIC PANEL - Abnormal; Notable for the following components:  ?    Result Value  ? Glucose, Bld 134 (*)   ? Total Bilirubin 1.3 (*)   ? All other components within normal limits  ?CBC WITH DIFFERENTIAL/PLATELET  ?URINALYSIS, ROUTINE W REFLEX MICROSCOPIC  ? ? ?EKG ?None ? ?Radiology ?CT Head Wo Contrast ? ?Result Date: 06/12/2021 ?CLINICAL DATA:  Neuro deficit, acute, stroke suspected EXAM: CT HEAD WITHOUT CONTRAST  TECHNIQUE: Contiguous axial images were obtained from the base of the skull through the vertex without intravenous contrast. RADIATION DOSE REDUCTION: This exam was performed according to the departmental dose-optimization program which includes automated exposure control, adjustment of the mA and/or kV according to patient size and/or use of iterative reconstruction technique. COMPARISON:  None. FINDINGS: Brain: No evidence of acute large vascular territory infarction, hemorrhage, hydrocephalus, extra-axial collection or mass lesion/mass effect. Vascular: No hyperdense vessel identified. Skull: No acute fracture. Sinuses/Orbits: Visualized sinuses are clear.  Unremarkable orbits. Other: No mastoid effusions. IMPRESSION: No evidence of acute intracranial abnormality. Electronically Signed   By: Feliberto Harts M.D.   On: 06/12/2021 15:07   ? ?Procedures ?Procedures  ? ? ?Medications Ordered in ED ?Medications  ?valACYclovir (VALTREX) tablet 1,000 mg (has no administration in time range)  ?predniSONE (DELTASONE) tablet 60 mg (has no administration in time range)  ? ? ?ED Course/ Medical Decision Making/ A&P ? This patient presents to the ED for concern of facial asymmetry, this involves an extensive number of treatment options, and is a complaint that carries with it a high risk of complications and morbidity.  The differential diagnosis includes stroke, Bell's palsy, neuropathy ? ? ?Co morbidities that complicate the patient evaluation ? ?Hypertension ? ? ?Social Determinants of Health: ? ?Hypertension, [employer] ? ? ?Additional history obtained: ? ?Additional history and/or information obtained from mother ?External records from outside source obtained and reviewed including history of present illness, time course ? ? ?After the initial evaluation, orders, including: CT, labs were initiated. ? ?Patient placed on Cardiac and Pulse-Oximetry Monitors. ?The patient was maintained on a cardiac monitor.  The cardiac  monitored showed an rhythm of 70 sinus normal ?The patient was also maintained on pulse oximetry. The readings were typically 100% room air normal ? ?On repeat evaluation of the patient stayed the same ? ?Lab Tests: ? ?I personally interpreted labs.  The pertinent results include: Borderline hyperglycemia ? ?Imaging Studies ordered: ? ?I independently visualized and interpreted imaging which showed no acute intracranial abnormalities ?I agree with the radiologist interpretation, and demonstrated the images to the patient and his mother at bedside ? ? ?Dispostion / Final MDM: ? ?After consideration of the diagnostic results and the patient's response to treatment, he will start Valtrex, prednisone for likely Bell's palsy.  Patient, mother and I discussed return precautions explicitly, importance of following up with his physician for ongoing blood pressure medication management.  With unremarkable head CT, neuro dysfunction consistent with Bell palsy, low suspicion for stroke or other acute new phenomena.  No evidence for concurrent infection or  other endorgan disease secondary to hypertensive crisis. ? ?Final Clinical Impression(s) / ED Diagnoses ?Final diagnoses:  ?Facial paralysis/Bells palsy  ? ? ?Rx / DC Orders ?ED Discharge Orders   ? ?      Ordered  ?  valACYclovir (VALTREX) 1000 MG tablet  3 times daily       ? 06/12/21 2000  ?  predniSONE (DELTASONE) 20 MG tablet  Daily with breakfast       ? 06/12/21 2000  ? ?  ?  ? ?  ? ? ?  ?Gerhard Munch, MD ?06/12/21 2007 ? ?

## 2021-06-12 NOTE — ED Triage Notes (Signed)
Pt. Stated, I started having slurred speech and drooping mouth since yesterday, eyes are not closing. ?

## 2021-06-12 NOTE — ED Provider Triage Note (Signed)
Emergency Medicine Provider Triage Evaluation Note ? ?Kyle Strong , a 31 y.o. male  was evaluated in triage.  Pt complains of right-sided facial weakness since 5 PM yesterday.  Was giving presentation when he noticed he began talking funny.  Denies recent illness or viral infection.  Started new medication of amlodipine/HCTZ 5 days ago.  Denies any sensory symptoms.  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  Pt concerned of stroke.  No prior hx. ? ?Review of Systems  ?Positive: As above ?Negative: As above ? ?Physical Exam  ?BP (!) 171/96 (BP Location: Left Arm)   Pulse 88   Temp 98.6 ?F (37 ?C) (Oral)   Resp 17   SpO2 94%  ?Gen:   Awake, no distress   ?Resp:  Normal effort  ?MSK:   Moves extremities without difficulty  ?Other:  No gross deficits CN2-12 grossly intact with the exception of CNVII - facial weakness/asymmetry of right side ? ?Medical Decision Making  ?Medically screening exam initiated at 2:37 PM.  Appropriate orders placed.  Kyle Strong was informed that the remainder of the evaluation will be completed by another provider, this initial triage assessment does not replace that evaluation, and the importance of remaining in the ED until their evaluation is complete. ? ?Labs imaging ordered ?  ?Cecil Cobbs, PA-C ?06/12/21 1441 ? ?

## 2021-06-12 NOTE — Discharge Instructions (Addendum)
As discussed, it is important that you monitor your condition carefully and do not hesitate to return here for concerning changes.  Otherwise follow-up with your physician for ongoing management of your hypertension and further consideration of today's elevated glucose level, 134. ?

## 2022-07-28 IMAGING — CT CT HEAD W/O CM
4 series · 16 of 47 positions shown, 18 images · non-contrast
Comparison: None.

CLINICAL DATA: Neuro deficit, acute, stroke suspected



[Series 3: head without · axial · non-contrast · 0.46mm/px · z∈[-53,+57]mm · 7 of 30 slices shown, 9 images]
[im 4/30  brain]
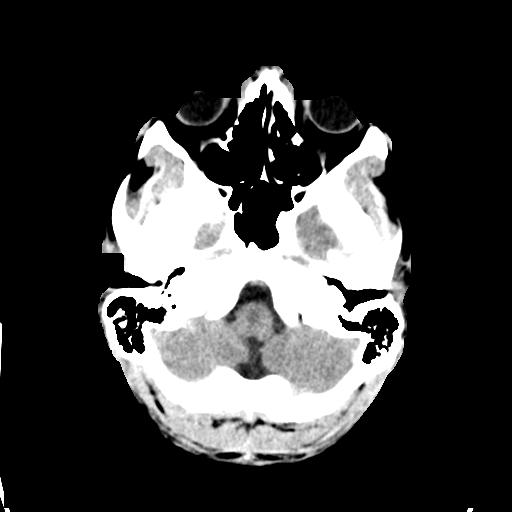
[im 4/30  bone]
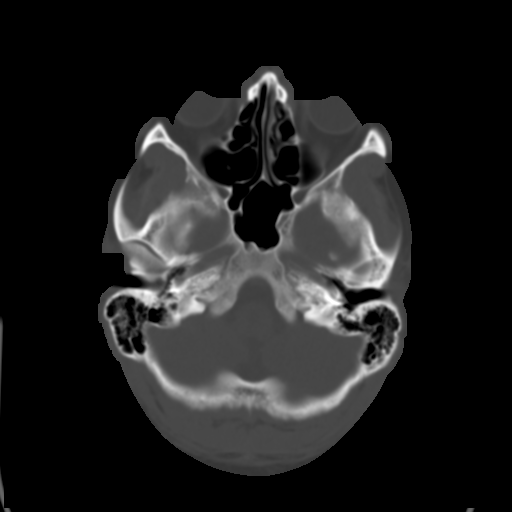
[im 8/30  brain]
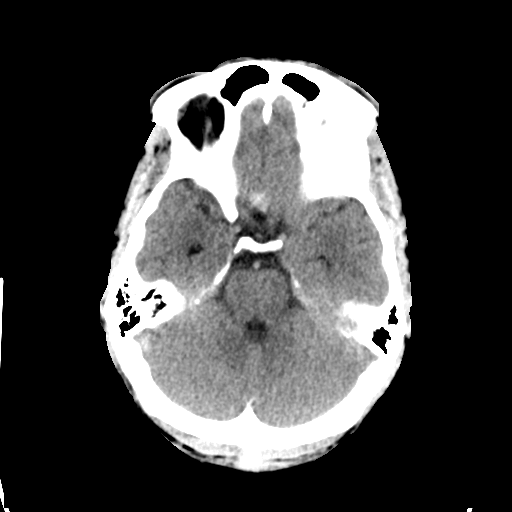
[im 11/30  brain]
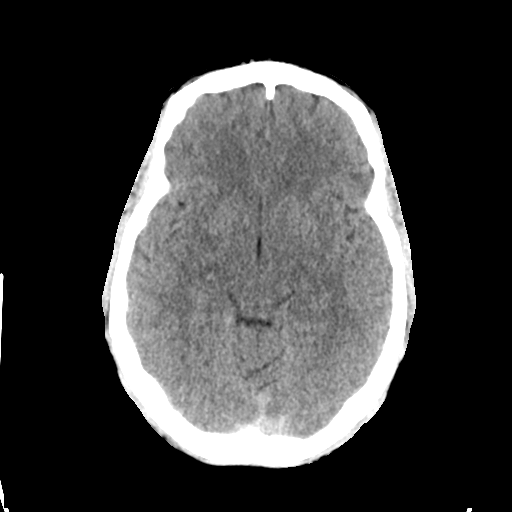
[im 15/30  brain]
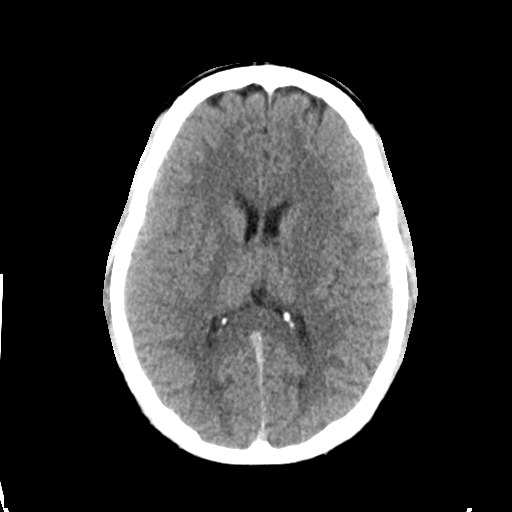
[im 19/30  brain]
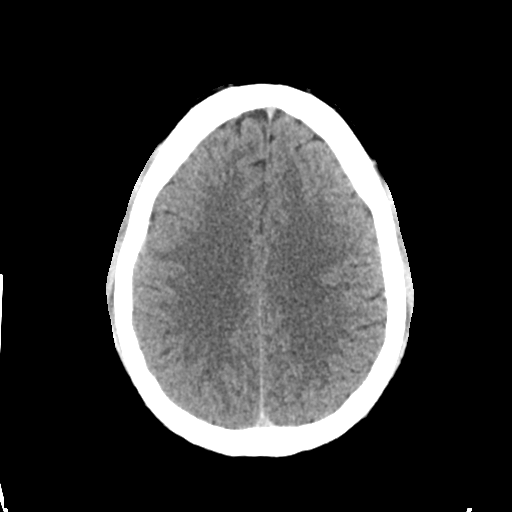
[im 19/30  bone]
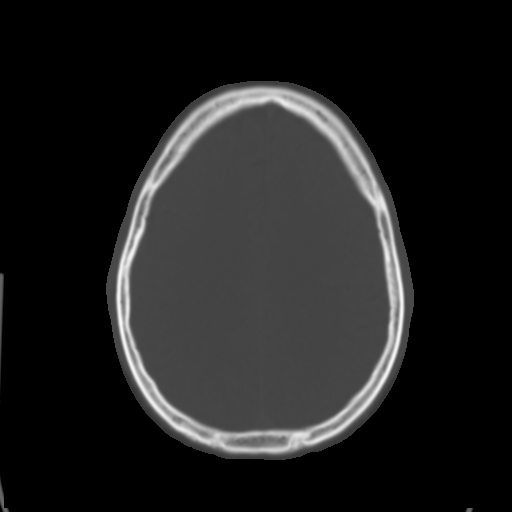
[im 22/30  brain]
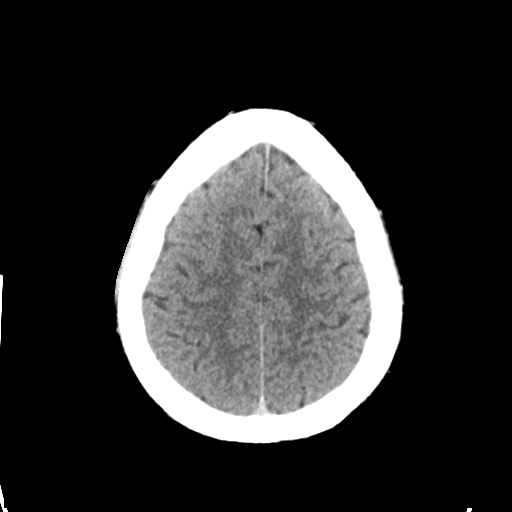
[im 26/30  brain]
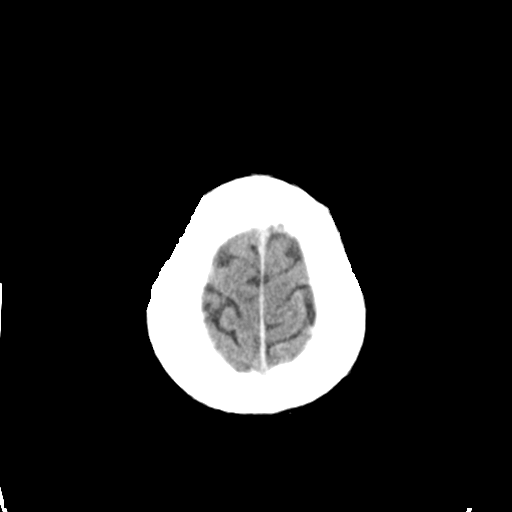

[Series 4: head bone · axial · 0.46mm/px · z∈[-54,-24]mm · 3 of 75 slices shown]
[im 8/75  bone]
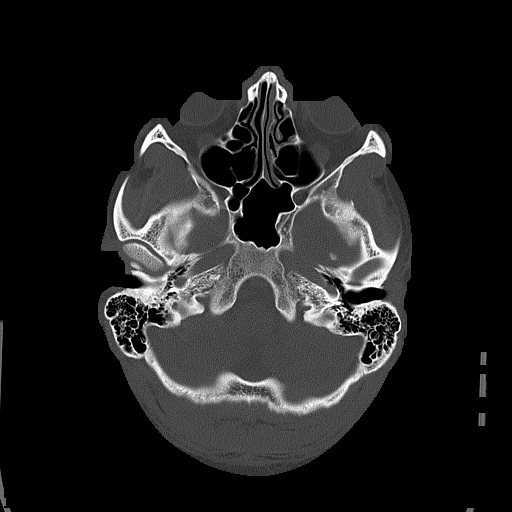
[im 15/75  bone]
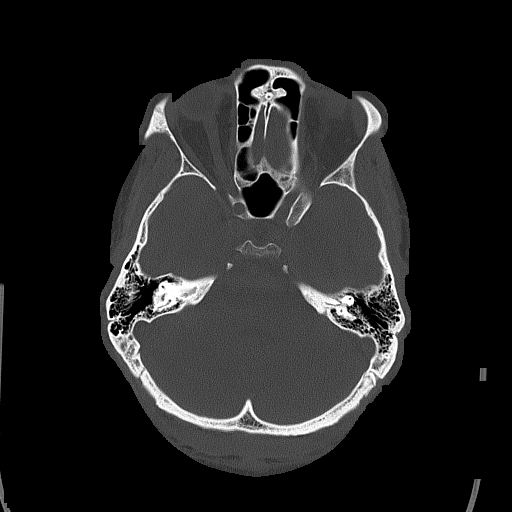
[im 23/75  bone]
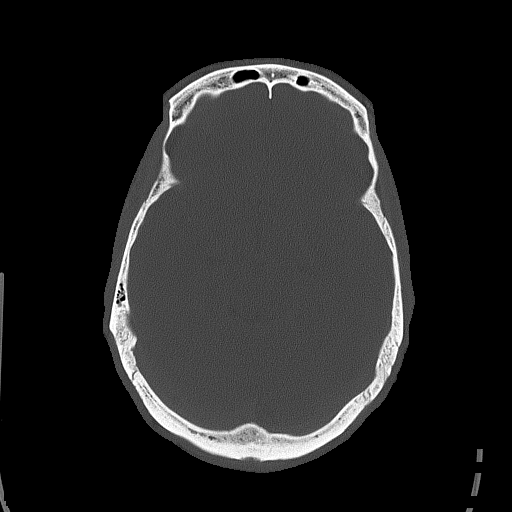

[Series 5: head without cor · coronal · non-contrast · 0.29mm/px · 3 of 69 slices shown]
[im 23/69  brain]
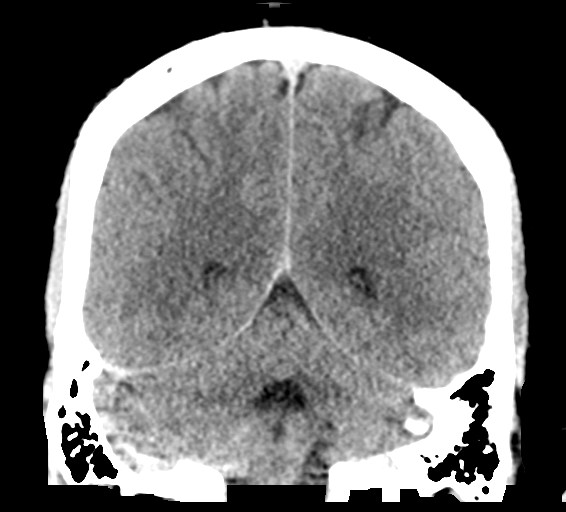
[im 31/69  brain]
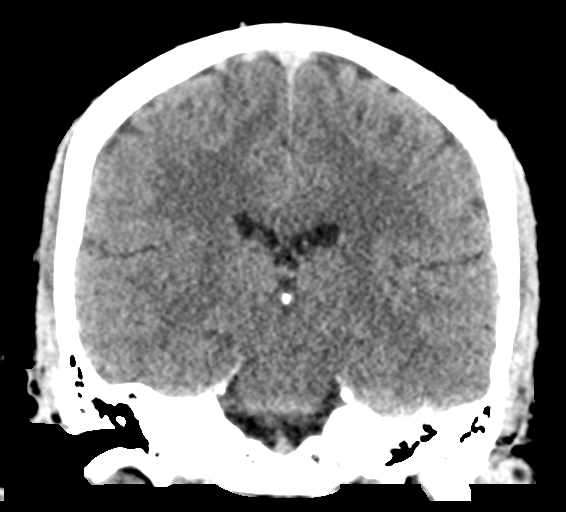
[im 38/69  brain]
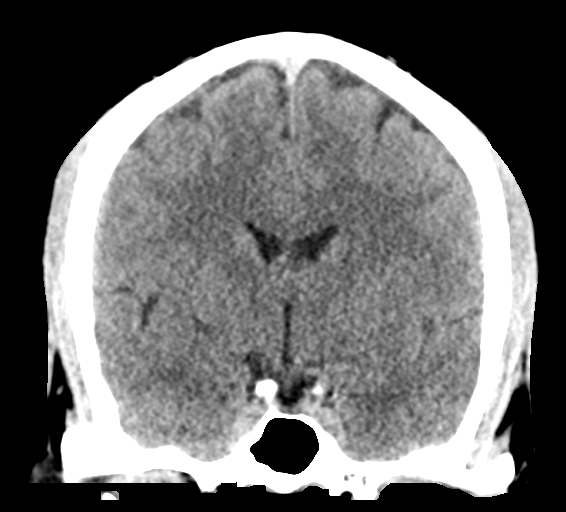

[Series 6: head without sag · sagittal · non-contrast · 0.29mm/px · 3 of 67 slices shown]
[im 23/67  brain]
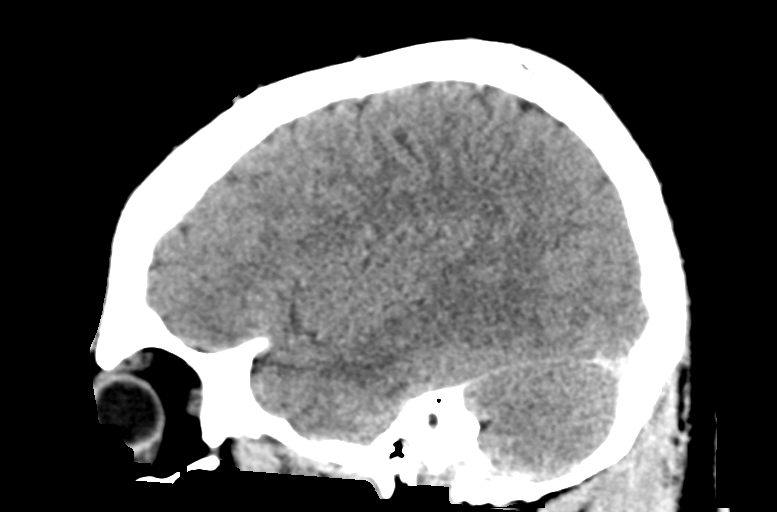
[im 34/67  brain]
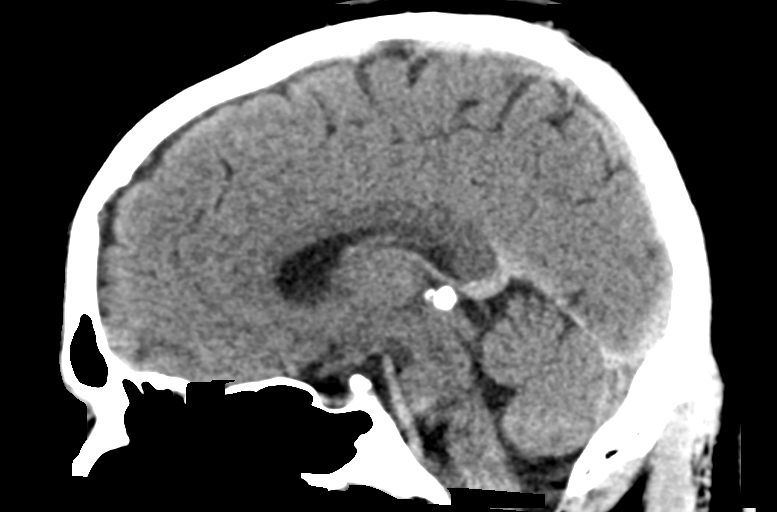
[im 45/67  brain]
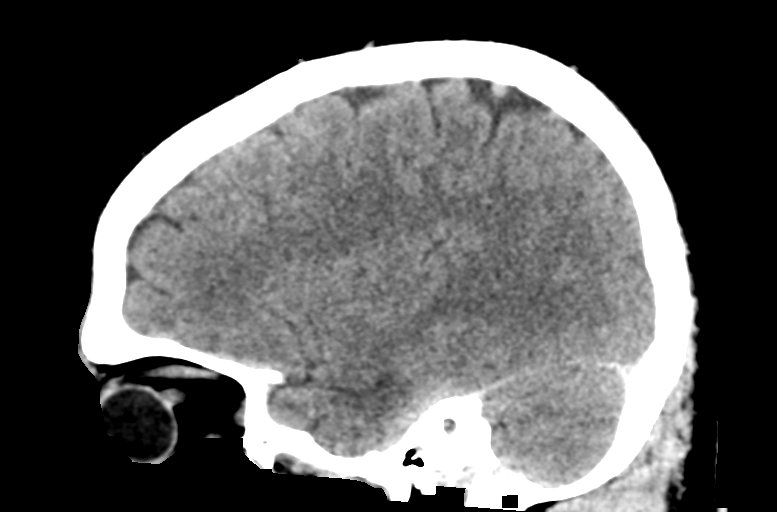

[16 of 47 positions shown; findings below may reference images not displayed]

FINDINGS: Brain: No evidence of acute large vascular territory infarction,
hemorrhage, hydrocephalus, extra-axial collection or mass
lesion/mass effect.

Vascular: No hyperdense vessel identified.

Skull: No acute fracture.

Sinuses/Orbits: Visualized sinuses are clear.  Unremarkable orbits.

Other: No mastoid effusions.
IMPRESSION: No evidence of acute intracranial abnormality.
